# Patient Record
Sex: Female | Born: 1975 | Race: Black or African American | Hispanic: No | Marital: Married | Smoking: Never smoker
Health system: Southern US, Community
[De-identification: ages and names within clinical notes are randomized; demographics above are authoritative.]

## PROBLEM LIST (undated history)

## (undated) DIAGNOSIS — O24419 Gestational diabetes mellitus in pregnancy, unspecified control: Secondary | ICD-10-CM

## (undated) HISTORY — PX: WISDOM TOOTH EXTRACTION: SHX21

---

## 2009-01-13 ENCOUNTER — Inpatient Hospital Stay (HOSPITAL_COMMUNITY): Admission: AD | Admit: 2009-01-13 | Discharge: 2009-01-16 | Payer: Self-pay | Admitting: Obstetrics and Gynecology

## 2009-03-23 ENCOUNTER — Inpatient Hospital Stay (HOSPITAL_COMMUNITY): Admission: AD | Admit: 2009-03-23 | Discharge: 2009-03-25 | Payer: Self-pay | Admitting: Obstetrics & Gynecology

## 2010-12-26 NOTE — L&D Delivery Note (Signed)
Delivery Note  Ampicillin infused prior to AROM at 0512 Complete dilation at 0533 Onset of pushing at 0535 FHR second stage 140s no decels  Anesthesia: local for repair only / 1% xylocaine  Delivery of a viable female  at 46 by CNM in LOA position - mom lateral left Nuchal Cord none. Cord double clamped after cessation of pulsation, cut by FOB.  Cord blood sample collected. Placenta delivered [facility] intact with 3 VC at 0549 Placenta to L&D for disposal. Uterine tone firm / bleeding mild  Partial 2nd degree laceration identified.  Repair 3-0 vicryl vaginal / 4-0 vicryl subcuticular Est. Blood Loss (mL): 300 ml  Complications: none  Mom to postpartum.  Baby to nursery-stable.  BAILEY,TANYA 09/17/2011, 6:43 AM

## 2011-02-11 ENCOUNTER — Encounter: Payer: BC Managed Care – PPO | Attending: Certified Nurse Midwife | Admitting: Dietician

## 2011-02-11 DIAGNOSIS — O24919 Unspecified diabetes mellitus in pregnancy, unspecified trimester: Secondary | ICD-10-CM | POA: Insufficient documentation

## 2011-02-11 DIAGNOSIS — E119 Type 2 diabetes mellitus without complications: Secondary | ICD-10-CM | POA: Insufficient documentation

## 2011-02-21 LAB — ANTIBODY SCREEN: Antibody Screen: NEGATIVE

## 2011-02-21 LAB — HIV ANTIBODY (ROUTINE TESTING W REFLEX): HIV: NONREACTIVE

## 2011-02-21 LAB — RUBELLA ANTIBODY, IGM: Rubella: IMMUNE

## 2011-02-21 LAB — ABO/RH: RH Type: POSITIVE

## 2011-04-07 LAB — GLUCOSE, CAPILLARY
Glucose-Capillary: 100 mg/dL — ABNORMAL HIGH (ref 70–99)
Glucose-Capillary: 106 mg/dL — ABNORMAL HIGH (ref 70–99)
Glucose-Capillary: 106 mg/dL — ABNORMAL HIGH (ref 70–99)
Glucose-Capillary: 112 mg/dL — ABNORMAL HIGH (ref 70–99)
Glucose-Capillary: 115 mg/dL — ABNORMAL HIGH (ref 70–99)
Glucose-Capillary: 126 mg/dL — ABNORMAL HIGH (ref 70–99)
Glucose-Capillary: 98 mg/dL (ref 70–99)

## 2011-04-07 LAB — CBC
HCT: 29 % — ABNORMAL LOW (ref 36.0–46.0)
Hemoglobin: 11.6 g/dL — ABNORMAL LOW (ref 12.0–15.0)
MCV: 86 fL (ref 78.0–100.0)
Platelets: 208 10*3/uL (ref 150–400)
RBC: 3.37 MIL/uL — ABNORMAL LOW (ref 3.87–5.11)
RDW: 14.8 % (ref 11.5–15.5)
WBC: 14.9 10*3/uL — ABNORMAL HIGH (ref 4.0–10.5)

## 2011-04-07 LAB — RPR: RPR Ser Ql: NONREACTIVE

## 2011-04-11 LAB — GLUCOSE, CAPILLARY
Glucose-Capillary: 121 mg/dL — ABNORMAL HIGH (ref 70–99)
Glucose-Capillary: 121 mg/dL — ABNORMAL HIGH (ref 70–99)
Glucose-Capillary: 125 mg/dL — ABNORMAL HIGH (ref 70–99)
Glucose-Capillary: 144 mg/dL — ABNORMAL HIGH (ref 70–99)
Glucose-Capillary: 147 mg/dL — ABNORMAL HIGH (ref 70–99)
Glucose-Capillary: 154 mg/dL — ABNORMAL HIGH (ref 70–99)
Glucose-Capillary: 157 mg/dL — ABNORMAL HIGH (ref 70–99)
Glucose-Capillary: 176 mg/dL — ABNORMAL HIGH (ref 70–99)
Glucose-Capillary: 231 mg/dL — ABNORMAL HIGH (ref 70–99)

## 2011-04-11 LAB — KETONES, URINE: Ketones, ur: NEGATIVE mg/dL

## 2011-04-11 LAB — FETAL FIBRONECTIN: Fetal Fibronectin: NEGATIVE

## 2011-05-13 NOTE — Discharge Summary (Signed)
Maria Ross, [place]               ACCOUNT NO.:  [id number]   MEDICAL RECORD NO.:  [id number]          PATIENT TYPE:  INP   LOCATION:  9159                          FACILITY:  [facility]   PHYSICIAN:  Maxie Better, M.D.DATE OF BIRTH:  [birth year]   DATE OF ADMISSION:  01/13/2009  DATE OF DISCHARGE:  01/16/2009                               DISCHARGE SUMMARY   ADMISSION DIAGNOSES:  1. Intrauterine gestation at 28 weeks.  2. Uncontrolled class A1 gestational diabetes.   DISCHARGE DIAGNOSES:  Intrauterine gestation at 28+ weeks with class A2  gestational diabetes, improved controlled, undelivered.   HISTORY OF PRESENT ILLNESS:  A 35 year old gravida 1, para 0 female at  50 weeks admitted for blood sugar control.   HOSPITAL COURSE:  The patient had elevated blood sugars greater than  200.  Her 1-hour glucose testing showed blood sugars that were elevated  at 305, 3 hours after the test.  The blood sugar was still 220.  Prenatal course otherwise had been unremarkable.  She has a strong  family history of diabetes.   HOSPITAL COURSE:  The patient was placed on the Antepartum Service.  Diabetes consultation was obtained.  She was started on insulin regimen.  Hemoglobin A1c was done, which was 8.5.  This suggest probably  preexisting diabetes that was previously unrecognized.  She underwent  teaching from the diabetes standpoint, diabetic teaching.  She also had  an ultrasound that showed a 3-pound-4-ounce at 85th percentile.  Fetal  echocardiogram was scheduled for the patient.  She had upper normal  limits of the amniotic fluid at 23.5.  Her abdominal circumference on  the ultrasound was at the 92nd percentile for the fetus.  The patient  was noted on ultrasound to incidentally have a cervical length of 1.5  cm.  Fetal fibronectin was done which was negative.  She had a reactive  nonstress test throughout her course.  There was no contractions.  On  January 16, 2009, the patient was  doing much better and was deemed well  to be able to be discharged as an outpatient with management.  Her NPH  was 50 units at a.m. and p.m. and NovoLog 9 units before meals.   DISPOSITION:  Home.   CONDITION:  Stable.   DISCHARGE MEDICATIONS:  The insulin per the recommendations.  Follow-up  appointment with Dr. Welton Flakes on January 20, 2009, for a fetal echo at  [facility] as well on January 19, 2009.  The discharge instructions  for preterm labor was given as well as for diabetic teaching.     Maxie Better, M.D.  Electronically Signed    [place]/MEDQ  D:  02/08/2009  T:  02/09/2009  Job:  [id number]

## 2011-09-17 ENCOUNTER — Inpatient Hospital Stay (HOSPITAL_COMMUNITY)
Admission: AD | Admit: 2011-09-17 | Discharge: 2011-09-19 | DRG: 372 | Disposition: A | Discharge status: Home / Self Care | Payer: BC Managed Care – PPO | Source: Ambulatory Visit | Attending: Obstetrics | Admitting: Obstetrics

## 2011-09-17 ENCOUNTER — Encounter (HOSPITAL_COMMUNITY): Payer: Self-pay | Admitting: *Deleted

## 2011-09-17 DIAGNOSIS — O99892 Other specified diseases and conditions complicating childbirth: Secondary | ICD-10-CM | POA: Diagnosis present

## 2011-09-17 DIAGNOSIS — Z22330 Carrier of Group B streptococcus: Secondary | ICD-10-CM

## 2011-09-17 DIAGNOSIS — O2432 Unspecified pre-existing diabetes mellitus in childbirth: Secondary | ICD-10-CM | POA: Diagnosis present

## 2011-09-17 DIAGNOSIS — E119 Type 2 diabetes mellitus without complications: Secondary | ICD-10-CM | POA: Diagnosis present

## 2011-09-17 HISTORY — DX: Gestational diabetes mellitus in pregnancy, unspecified control: O24.419

## 2011-09-17 LAB — GLUCOSE, CAPILLARY: Glucose-Capillary: 83 mg/dL (ref 70–99)

## 2011-09-17 LAB — ABO/RH: ABO/RH(D): O POS

## 2011-09-17 LAB — CBC
HCT: 31.2 % — ABNORMAL LOW (ref 36.0–46.0)
Hemoglobin: 10.4 g/dL — ABNORMAL LOW (ref 12.0–15.0)
MCH: 26.7 pg (ref 26.0–34.0)
MCHC: 33.3 g/dL (ref 30.0–36.0)
MCV: 80 fL (ref 78.0–100.0)
Platelets: 257 10*3/uL (ref 150–400)
RBC: 3.9 MIL/uL (ref 3.87–5.11)
RDW: 14.9 % (ref 11.5–15.5)
WBC: 12 10*3/uL — ABNORMAL HIGH (ref 4.0–10.5)

## 2011-09-17 LAB — RPR: RPR Ser Ql: NONREACTIVE

## 2011-09-17 MED ORDER — OXYCODONE-ACETAMINOPHEN 5-325 MG PO TABS: 2.0000 | ORAL_TABLET | ORAL | Status: DC | PRN

## 2011-09-17 MED ORDER — BENZOCAINE-MENTHOL 20-0.5 % EX AERO
1.0000 "application " | INHALATION_SPRAY | CUTANEOUS | Status: DC | PRN
  Administered 2011-09-17: 1 via TOPICAL
  Filled 2011-09-17: qty 56

## 2011-09-17 MED ORDER — OXYTOCIN 10 UNIT/ML IJ SOLN
INTRAMUSCULAR | Status: AC
  Filled 2011-09-17: qty 2

## 2011-09-17 MED ORDER — HYDROCORTISONE ACE-PRAMOXINE 1-1 % RE CREA
TOPICAL_CREAM | Freq: Two times a day (BID) | RECTAL | Status: DC
  Filled 2011-09-17: qty 30

## 2011-09-17 MED ORDER — LANOLIN HYDROUS EX OINT: TOPICAL_OINTMENT | CUTANEOUS | Status: DC | PRN

## 2011-09-17 MED ORDER — OXYTOCIN 20 UNITS IN LACTATED RINGERS INFUSION - SIMPLE
125.0000 mL/h | Freq: Once | INTRAVENOUS | Status: AC
  Administered 2011-09-17: 500 mL/h via INTRAVENOUS

## 2011-09-17 MED ORDER — DIPHENHYDRAMINE HCL 25 MG PO CAPS: 25.0000 mg | ORAL_CAPSULE | Freq: Four times a day (QID) | ORAL | Status: DC | PRN

## 2011-09-17 MED ORDER — LIDOCAINE HCL (PF) 1 % IJ SOLN
30.0000 mL | INTRAMUSCULAR | Status: DC | PRN
  Filled 2011-09-17: qty 30

## 2011-09-17 MED ORDER — ONDANSETRON HCL 4 MG/2ML IJ SOLN: 4.0000 mg | Freq: Four times a day (QID) | INTRAMUSCULAR | Status: DC | PRN

## 2011-09-17 MED ORDER — IBUPROFEN 600 MG PO TABS
600.0000 mg | ORAL_TABLET | Freq: Four times a day (QID) | ORAL | Status: DC
  Administered 2011-09-17 – 2011-09-19 (×9): 600 mg via ORAL
  Filled 2011-09-17 (×8): qty 1

## 2011-09-17 MED ORDER — CITRIC ACID-SODIUM CITRATE 334-500 MG/5ML PO SOLN: 30.0000 mL | ORAL | Status: DC | PRN

## 2011-09-17 MED ORDER — ACETAMINOPHEN 325 MG PO TABS: 650.0000 mg | ORAL_TABLET | ORAL | Status: DC | PRN

## 2011-09-17 MED ORDER — LACTATED RINGERS IV SOLN: 500.0000 mL | INTRAVENOUS | Status: DC | PRN

## 2011-09-17 MED ORDER — SODIUM CHLORIDE 0.9 % IV SOLN
2.0000 g | Freq: Four times a day (QID) | INTRAVENOUS | Status: DC
  Administered 2011-09-17: 2 g via INTRAVENOUS
  Filled 2011-09-17 (×2): qty 2000

## 2011-09-17 MED ORDER — FLEET ENEMA 7-19 GM/118ML RE ENEM: 1.0000 | ENEMA | RECTAL | Status: DC | PRN

## 2011-09-17 MED ORDER — CEFAZOLIN SODIUM 1-5 GM-% IV SOLN
1.0000 g | Freq: Three times a day (TID) | INTRAVENOUS | Status: DC
  Filled 2011-09-17: qty 50

## 2011-09-17 MED ORDER — IBUPROFEN 600 MG PO TABS: 600.0000 mg | ORAL_TABLET | Freq: Four times a day (QID) | ORAL | Status: DC | PRN

## 2011-09-17 MED ORDER — OXYCODONE-ACETAMINOPHEN 5-325 MG PO TABS: 1.0000 | ORAL_TABLET | ORAL | Status: DC | PRN

## 2011-09-17 MED ORDER — SIMETHICONE 80 MG PO CHEW: 80.0000 mg | CHEWABLE_TABLET | ORAL | Status: DC | PRN

## 2011-09-17 MED ORDER — LACTATED RINGERS IV SOLN
INTRAVENOUS | Status: DC
  Administered 2011-09-17: 05:00:00 via INTRAVENOUS

## 2011-09-17 MED ORDER — METFORMIN HCL 500 MG PO TABS
1000.0000 mg | ORAL_TABLET | Freq: Two times a day (BID) | ORAL | Status: DC
  Administered 2011-09-17 – 2011-09-19 (×5): 1000 mg via ORAL
  Filled 2011-09-17 (×7): qty 2

## 2011-09-17 MED ORDER — OXYTOCIN BOLUS FROM INFUSION
500.0000 mL | Freq: Once | INTRAVENOUS | Status: DC
  Filled 2011-09-17: qty 1000
  Filled 2011-09-17: qty 500

## 2011-09-17 MED ORDER — WITCH HAZEL-GLYCERIN EX PADS: MEDICATED_PAD | CUTANEOUS | Status: DC | PRN

## 2011-09-17 MED ORDER — CEFAZOLIN SODIUM-DEXTROSE 2-3 GM-% IV SOLR
2.0000 g | Freq: Once | INTRAVENOUS | Status: DC
  Filled 2011-09-17: qty 50

## 2011-09-17 NOTE — H&P (Signed)
  OB ADMISSION/ HISTORY & PHYSICAL:  Admission Date: 09/17/2011  4:43 AM  Admit Diagnosis: Active Labor / IDDM  Maria Ross is a 35 y.o. female presenting for active labor at 37 4/7 weeks.  Prenatal History: [id number]   EDC : 10/04/2011, by Patient Reported  Prenatal care at [facility] since [redacted] weeks gestation  Prenatal course complicated by pre-existing DM on metformin.  Prenatal Labs: ABO, Rh: O (02/27 0000)  Antibody: Negative (02/27 0000) Rubella: Immune (02/27 0000)  RPR: Nonreactive (02/27 0000)  HBsAg: Negative (02/27 0000)  HIV: Non-reactive (02/27 0000)  GBS: Positive (09/04 0000)  1 hr Glucola :IDDM - pre-existing DM   Medical / Surgical History :  Past medical history:  IDDM - pre-existing diabetes   Past surgical history:  Past Surgical History  Procedure Date  . Wisdom tooth extraction      Family History: History reviewed. No pertinent family history. Mother IDDM.  Social History:  does not have a smoking history on file. She does not have any smokeless tobacco history on file. She reports that she does not drink alcohol or use illicit drugs.   Allergies: Review of patient's allergies indicates no known allergies.    Current Medications at time of admission: None this AM Metformin 1000mg  BID NPH 24/42 Humalog 10/04/09   Review of Systems: Onset of Labor 0330 - hx fast labor Painful regular ctx No LOF or SROM + bloody show    Physical Exam:  Uncomfortable breathing hard with ctx / calm between ctx Lungs - clear Heart - RRR Abdomen- gravid / non-tender Extremities - 1+ dependent edema  Dilation: 9 Effacement (%): 80 Station: -2 Exam by:: Maria Ross,CNM  FHR 140's no decels Toco ctx every 2 minutes and strong  BS - 88 on admit    Assessment: IDDM - well controlled with AGA fetus + GBS Active Labor   Plan:  Admit AROM Imminent birth  [facility] 09/17/2011, 6:32 AM

## 2011-09-18 LAB — CBC
HCT: 27.4 % — ABNORMAL LOW (ref 36.0–46.0)
Hemoglobin: 9.1 g/dL — ABNORMAL LOW (ref 12.0–15.0)
MCH: 26.9 pg (ref 26.0–34.0)
MCHC: 33.2 g/dL (ref 30.0–36.0)
MCV: 81.1 fL (ref 78.0–100.0)
Platelets: 220 10*3/uL (ref 150–400)
RBC: 3.38 MIL/uL — ABNORMAL LOW (ref 3.87–5.11)
RDW: 15.1 % (ref 11.5–15.5)
WBC: 13.4 10*3/uL — ABNORMAL HIGH (ref 4.0–10.5)

## 2011-09-18 LAB — GLUCOSE, CAPILLARY
Glucose-Capillary: 150 mg/dL — ABNORMAL HIGH (ref 70–99)
Glucose-Capillary: 118 mg/dL — ABNORMAL HIGH (ref 70–99)
Glucose-Capillary: 138 mg/dL — ABNORMAL HIGH (ref 70–99)

## 2011-09-18 MED ORDER — BENZOCAINE-MENTHOL 20-0.5 % EX AERO
INHALATION_SPRAY | CUTANEOUS | Status: AC
  Filled 2011-09-18: qty 56

## 2011-09-18 MED ORDER — INSULIN NPH (HUMAN) (ISOPHANE) 100 UNIT/ML ~~LOC~~ SUSP
8.0000 [IU] | Freq: Two times a day (BID) | SUBCUTANEOUS | Status: DC
  Administered 2011-09-18 – 2011-09-19 (×3): 8 [IU] via SUBCUTANEOUS
  Filled 2011-09-18: qty 10

## 2011-09-18 NOTE — Progress Notes (Signed)
  PPD 1 SVD  S:  Reports feeling well             Tolerating po/ No nausea or vomiting             Bleeding is light             Pain controlled withprescription NSAID's including motrin and metformin             Up ad lib / ambulatory  Newborn breast feeding  / Circumcision desired   O:  A & O x 3 NAD             VS: Blood pressure 107/71, pulse 74, temperature 97.6 F (36.4 C), temperature source Oral, resp. rate 20, height 5\' 7"  (1.702 m), weight 103.42 kg (228 lb), SpO2 98.00%, unknown if currently breastfeeding.  LABS: Lab Results  Component Value Date   WBC 13.4* 09/18/2011   HGB 9.1* 09/18/2011   HCT 27.4* 09/18/2011   MCV 81.1 09/18/2011   PLT 220 09/18/2011    FBS 119 /  Postprandial 134/144/145 yesterday   Lungs: unlabored  Heart: regular rate and rhythm  Abdomen: soft, non-tender, non-distended              Fundus: firm, non-tender, U-2  Perineum: no edema  Lochia: light  Extremities: 1+ edema, no calf pain or tenderness    A: PPD # 1               DM - metformin 1000 BID  Doing well - stable status  P:  Routine post partum orders  Add back some NPH and continue close glucose monitoring  Maura Braaten 09/18/2011, 8:37 AM

## 2011-09-19 LAB — GLUCOSE, CAPILLARY: Glucose-Capillary: 101 mg/dL — ABNORMAL HIGH (ref 70–99)

## 2011-09-19 MED ORDER — INSULIN NPH (HUMAN) (ISOPHANE) 100 UNIT/ML ~~LOC~~ SUSP: 10.0000 [IU] | Freq: Two times a day (BID) | SUBCUTANEOUS | Status: DC

## 2011-09-19 MED ORDER — HYDROCORTISONE ACE-PRAMOXINE 1-1 % RE CREA: TOPICAL_CREAM | Freq: Two times a day (BID) | RECTAL | Status: AC

## 2011-09-19 MED ORDER — IBUPROFEN 600 MG PO TABS: 600.0000 mg | ORAL_TABLET | Freq: Four times a day (QID) | ORAL | Status: AC

## 2011-09-19 NOTE — Discharge Summary (Signed)
Obstetric Discharge Summary Reason for Admission: onset of labor Prenatal Procedures: NST and ultrasound Intrapartum Procedures: spontaneous vaginal delivery Postpartum Procedures: none Complications-Operative and Postpartum: 2 degree perineal laceration Hemoglobin  Date Value Range Status  09/18/2011 9.1* 12.0-15.0 (g/dL) Final     HCT  Date Value Range Status  09/18/2011 27.4* 36.0-46.0 (%) Final    Discharge Diagnoses: Term Pregnancy-delivered and Pre-existing IDDM  Discharge Information: Date: 09/19/2011 Activity: pelvic rest Diet: routine Medications: PNV, Ibuprofen and Metformin 1000BID, NPH 10 units BID, OTC iron and vitamin D Condition: stable Instructions: refer to practice specific booklet Discharge to: home Follow-up Information    Follow up with Maria Ross. Make an appointment in 6 weeks.   Contact information:   [address] [place] [address] [phone]          Newborn Data: Live born female  Birth Weight: 6 lb 11.6 oz (3050 g) APGAR: 9, 9  Home with mother.  Maria Ross 09/19/2011, 8:45 AM

## 2011-09-19 NOTE — Progress Notes (Signed)
  PPD 2 SVD  S:  Reports feeling well - no pain             Tolerating po/ No nausea or vomiting             Bleeding is spotting             Pain controlled withprescription NSAID's including motrin             Up ad lib / ambulatory  Newborn breast feeding no issues  / Circumcision today prior to discharge             External labial and vaginal itching - intensely since late last night ( Ampicillin for GBS)             "starving "since delivery - wants to increase carbs but concerned about BS elevation  O:  A & O x 3 NAD             VS: Blood pressure 102/64, pulse 106, temperature 97.9 F (36.6 C), temperature source Oral, resp. rate 18, height 5\' 7"  (1.702 m), weight 103.42 kg (228 lb), SpO2 98.00%, unknown if currently breastfeeding.  LABS: Lab Results  Component Value Date   WBC 13.4* 09/18/2011   HGB 9.1* 09/18/2011   HCT 27.4* 09/18/2011   MCV 81.1 09/18/2011   PLT 220 09/18/2011    FBS 100 today / postprandial yesterday : 150-118-138 ( 8 units NPH BID)   Lungs: Clear and unlabored  Heart: regular rate and rhythm / no mumurs  Abdomen: soft, non-tender, non-distended              Fundus: firm, non-tender, U-2  Perineum: no edema / labial erythema and wetness appearance  Lochia: spotting  Extremities: trace edema, no calf pain or tenderness    A: PPD # 2             DM -insulin / agent requiring   Doing well - stable status  P:  Routine post partum orders  Discharge home             Fluconazole x 3 / ok topical antifungal (gyn-lotrimin) daily as needed             Increase NPH to 10 unit BID with increase in carbs at home              Continue metformin 1000 BID             Monitor BS - update CNM every 3 days with BS - call if less than 80 or over 180 for adjustments  Cederick Broadnax 09/19/2011, 8:34 AM

## 2011-10-04 NOTE — Progress Notes (Signed)
UR Chart review completed.  

## 2014-10-27 ENCOUNTER — Encounter (HOSPITAL_COMMUNITY): Payer: Self-pay | Admitting: *Deleted

## 2015-12-27 NOTE — L&D Delivery Note (Signed)
Delivery Note  First Stage: Labor onset: 1000 Augmentation : none Analgesia /Anesthesia intrapartum: none AROM at 1621  Second Stage: Complete dilation at 1647 Onset of pushing at 1715 FHR second stage variables to 90 nadir  Delivery of a viable female at [id number] by CNM in LOA position with mom in hands-knees no nuchal cord Cord double clamped after cessation of pulsation, cut by FOB Cord blood sample collected   Third Stage: Placenta delivered [facility] intact with 3 VC @ 1751 Placenta disposition: hospital disposal Uterine tone firm / bleeding moderate  1st degree perineal laceration identified  Anesthesia for repair: 1% xylocaine local Repair 4-0 vicryl subcuticular Est. Blood Loss (mL): 450  Complications: none  Mom to postpartum.  Baby to Couplet care / Skin to Skin.  Newborn: Birth Weight: 8-10 Apgar Scores: 7-9 Feeding planned: breast  Maria Ross, Maria Ross CNM, MSN, FACNM 09/09/2016, 6:03 PM

## 2016-03-01 LAB — OB RESULTS CONSOLE GC/CHLAMYDIA
Chlamydia: NEGATIVE
GC PROBE AMP, GENITAL: NEGATIVE

## 2016-03-01 LAB — OB RESULTS CONSOLE HEPATITIS B SURFACE ANTIGEN: Hepatitis B Surface Ag: NEGATIVE

## 2016-03-01 LAB — OB RESULTS CONSOLE ABO/RH: RH Type: POSITIVE

## 2016-03-01 LAB — OB RESULTS CONSOLE RPR: RPR: NONREACTIVE

## 2016-03-01 LAB — OB RESULTS CONSOLE RUBELLA ANTIBODY, IGM: Rubella: IMMUNE

## 2016-03-01 LAB — OB RESULTS CONSOLE HIV ANTIBODY (ROUTINE TESTING): HIV: NONREACTIVE

## 2016-07-01 ENCOUNTER — Inpatient Hospital Stay (HOSPITAL_COMMUNITY): Admission: AD | Admit: 2016-07-01 | Payer: Self-pay | Source: Ambulatory Visit | Admitting: Obstetrics and Gynecology

## 2016-08-18 LAB — OB RESULTS CONSOLE GBS: STREP GROUP B AG: POSITIVE

## 2016-09-09 ENCOUNTER — Encounter (HOSPITAL_COMMUNITY): Payer: Self-pay | Admitting: *Deleted

## 2016-09-09 ENCOUNTER — Inpatient Hospital Stay (HOSPITAL_COMMUNITY)
Admission: AD | Admit: 2016-09-09 | Discharge: 2016-09-11 | DRG: 774 | Disposition: A | Discharge status: Home / Self Care | Payer: BLUE CROSS/BLUE SHIELD | Source: Ambulatory Visit | Attending: Obstetrics and Gynecology | Admitting: Obstetrics and Gynecology

## 2016-09-09 DIAGNOSIS — D509 Iron deficiency anemia, unspecified: Secondary | ICD-10-CM | POA: Diagnosis present

## 2016-09-09 DIAGNOSIS — O2412 Pre-existing diabetes mellitus, type 2, in childbirth: Principal | ICD-10-CM | POA: Diagnosis present

## 2016-09-09 DIAGNOSIS — O1205 Gestational edema, complicating the puerperium: Secondary | ICD-10-CM | POA: Diagnosis present

## 2016-09-09 DIAGNOSIS — E119 Type 2 diabetes mellitus without complications: Secondary | ICD-10-CM | POA: Diagnosis present

## 2016-09-09 DIAGNOSIS — O99824 Streptococcus B carrier state complicating childbirth: Secondary | ICD-10-CM | POA: Diagnosis present

## 2016-09-09 DIAGNOSIS — Z3A38 38 weeks gestation of pregnancy: Secondary | ICD-10-CM

## 2016-09-09 DIAGNOSIS — Z794 Long term (current) use of insulin: Secondary | ICD-10-CM | POA: Diagnosis not present

## 2016-09-09 DIAGNOSIS — O9902 Anemia complicating childbirth: Secondary | ICD-10-CM | POA: Diagnosis present

## 2016-09-09 LAB — GLUCOSE, CAPILLARY
GLUCOSE-CAPILLARY: 107 mg/dL — AB (ref 65–99)
Glucose-Capillary: 132 mg/dL — ABNORMAL HIGH (ref 65–99)
Glucose-Capillary: 185 mg/dL — ABNORMAL HIGH (ref 65–99)

## 2016-09-09 LAB — CBC
HCT: 29.8 % — ABNORMAL LOW (ref 36.0–46.0)
Hemoglobin: 10.1 g/dL — ABNORMAL LOW (ref 12.0–15.0)
MCH: 26.8 pg (ref 26.0–34.0)
MCHC: 33.9 g/dL (ref 30.0–36.0)
MCV: 79 fL (ref 78.0–100.0)
Platelets: 266 10*3/uL (ref 150–400)
RBC: 3.77 MIL/uL — ABNORMAL LOW (ref 3.87–5.11)
RDW: 15.3 % (ref 11.5–15.5)
WBC: 12.2 10*3/uL — ABNORMAL HIGH (ref 4.0–10.5)

## 2016-09-09 LAB — TYPE AND SCREEN
ABO/RH(D): O POS
Antibody Screen: NEGATIVE

## 2016-09-09 MED ORDER — ACETAMINOPHEN 325 MG PO TABS: 650.0000 mg | ORAL_TABLET | ORAL | Status: DC | PRN

## 2016-09-09 MED ORDER — IBUPROFEN 600 MG PO TABS
600.0000 mg | ORAL_TABLET | Freq: Four times a day (QID) | ORAL | Status: DC
  Administered 2016-09-09 – 2016-09-10 (×3): 600 mg via ORAL
  Filled 2016-09-09 (×3): qty 1

## 2016-09-09 MED ORDER — OXYCODONE-ACETAMINOPHEN 5-325 MG PO TABS: 1.0000 | ORAL_TABLET | ORAL | Status: DC | PRN

## 2016-09-09 MED ORDER — LACTATED RINGERS IV SOLN: 500.0000 mL | INTRAVENOUS | Status: DC | PRN

## 2016-09-09 MED ORDER — OXYTOCIN 40 UNITS IN LACTATED RINGERS INFUSION - SIMPLE MED
INTRAVENOUS | Status: AC
  Administered 2016-09-09: 62.5 mL/h
  Filled 2016-09-09: qty 1000

## 2016-09-09 MED ORDER — DIBUCAINE 1 % RE OINT: 1.0000 "application " | TOPICAL_OINTMENT | RECTAL | Status: DC | PRN

## 2016-09-09 MED ORDER — METFORMIN HCL 500 MG PO TABS
1000.0000 mg | ORAL_TABLET | Freq: Two times a day (BID) | ORAL | Status: DC
  Administered 2016-09-09 – 2016-09-11 (×4): 1000 mg via ORAL
  Filled 2016-09-09 (×4): qty 2

## 2016-09-09 MED ORDER — COCONUT OIL OIL: 1.0000 "application " | TOPICAL_OIL | Status: DC | PRN

## 2016-09-09 MED ORDER — SODIUM CHLORIDE 0.9 % IV SOLN
2.0000 g | Freq: Once | INTRAVENOUS | Status: AC
  Administered 2016-09-09: 2 g via INTRAVENOUS
  Filled 2016-09-09: qty 2000

## 2016-09-09 MED ORDER — VITAMIN K1 1 MG/0.5ML IJ SOLN
INTRAMUSCULAR | Status: AC
  Filled 2016-09-09: qty 0.5

## 2016-09-09 MED ORDER — OXYTOCIN 40 UNITS IN LACTATED RINGERS INFUSION - SIMPLE MED: 2.5000 [IU]/h | INTRAVENOUS | Status: DC

## 2016-09-09 MED ORDER — LACTATED RINGERS IV SOLN
INTRAVENOUS | Status: DC
  Administered 2016-09-09: 125 mL via INTRAVENOUS

## 2016-09-09 MED ORDER — OXYCODONE-ACETAMINOPHEN 5-325 MG PO TABS: 2.0000 | ORAL_TABLET | ORAL | Status: DC | PRN

## 2016-09-09 MED ORDER — INSULIN NPH (HUMAN) (ISOPHANE) 100 UNIT/ML ~~LOC~~ SUSP
10.0000 [IU] | Freq: Two times a day (BID) | SUBCUTANEOUS | Status: DC
  Administered 2016-09-09 – 2016-09-10 (×2): 10 [IU] via SUBCUTANEOUS
  Filled 2016-09-09: qty 10

## 2016-09-09 MED ORDER — SENNOSIDES-DOCUSATE SODIUM 8.6-50 MG PO TABS
2.0000 | ORAL_TABLET | ORAL | Status: DC
  Administered 2016-09-10 (×2): 2 via ORAL
  Filled 2016-09-09 (×2): qty 2

## 2016-09-09 MED ORDER — WITCH HAZEL-GLYCERIN EX PADS: 1.0000 "application " | MEDICATED_PAD | CUTANEOUS | Status: DC | PRN

## 2016-09-09 MED ORDER — BENZOCAINE-MENTHOL 20-0.5 % EX AERO: 1.0000 "application " | INHALATION_SPRAY | CUTANEOUS | Status: DC | PRN

## 2016-09-09 MED ORDER — OXYTOCIN BOLUS FROM INFUSION
500.0000 mL | Freq: Once | INTRAVENOUS | Status: AC
  Administered 2016-09-09: 500 mL via INTRAVENOUS

## 2016-09-09 MED ORDER — SOD CITRATE-CITRIC ACID 500-334 MG/5ML PO SOLN: 30.0000 mL | ORAL | Status: DC | PRN

## 2016-09-09 MED ORDER — SIMETHICONE 80 MG PO CHEW: 80.0000 mg | CHEWABLE_TABLET | ORAL | Status: DC | PRN

## 2016-09-09 MED ORDER — LIDOCAINE HCL (PF) 1 % IJ SOLN
30.0000 mL | INTRAMUSCULAR | Status: DC | PRN
  Administered 2016-09-09: 30 mL via SUBCUTANEOUS
  Filled 2016-09-09: qty 30

## 2016-09-09 NOTE — H&P (Signed)
  OB ADMISSION/ HISTORY & PHYSICAL:  Admission Date: 09/09/2016  2:14 PM  Admit Diagnosis: 38.3 weeks / active labor / IDDM (pre-existing type 2 DM) / positive GBS  Maria Ross is a 40 y.o. female presenting for onset of labor.  Prenatal History: [id number]   EDC : 09/20/2016, by Other Basis  [facility]  Primary Ob Provider: Marlinda Mikeanya Conlin Brahm CNM / Dr Juliene PinaMody  Prenatal course complicated by IDDM (pre-existing type 2 DM) / positive GBS / hx rapid labor progeression  Twice weekly fetal surveillance - normal / AGA / normal AFI  Prenatal Labs: ABO, Rh: O/Positive/-- (03/07 0000) Antibody:  negative Rubella: Immune (03/07 0000)  RPR: Nonreactive (03/07 0000)  HBsAg: Negative (03/07 0000)  HIV: Non-reactive (03/07 0000)  GTT: pre-existing DM - A1c6.0 (July 2017) GBS: Positive (08/24 0000)  Normal renal function  Medical / Surgical History :  Past medical history:  Past Medical History:  Diagnosis Date  . Gestational diabetes         Type 2 diabetes - insulin requiring  Past surgical history:  Past Surgical History:  Procedure Laterality Date  . WISDOM TOOTH EXTRACTION      Family History: History reviewed. No pertinent family history.   Social History:  reports that she has never smoked. She does not have any smokeless tobacco history on file. She reports that she does not drink alcohol or use drugs.   Allergies: Review of patient's allergies indicates no known allergies.    Current Medications at time of admission:  Prior to Admission medications   NPH Am 22/ PM 42 humalog 12-16 AC meals Metformin 500 BID  Review of Systems: Active FM onset of ctx @ 1000 currently every 4-5 minutes No LOF bloody show present  Physical Exam:  VS: Weight 105.7 kg (233 lb), unknown if currently breastfeeding.  General: alert and oriented, appears calm and comfortable Heart: RRR Lungs: Clear lung fields Abdomen: Gravid, soft and non-tender,  non-distended / uterus: gravid Extremities: 2+ pedal  edema  Genitalia / VE:  8cm / 90% / BBOW / vtx -1  FHR: baseline rate 150 / variability moderate / accelerations + / no decelerations TOCO: Q 4  Assessment: 38.[redacted] weeks gestation active stage of labor FHR category 1 GBS positive Hx rapid labor progression Type 2 DM - IDDM  Plan:  Admit Ampicillin 2gm IVPB Expectant management - anticipate SVB  Dr Cherly Hensenousins notified of admission / plan of care   Marlinda MikeBAILEY, Maria Ross CNM, MSN, [facility] 09/09/2016, 3:30 PM

## 2016-09-09 NOTE — Progress Notes (Addendum)
Pt admitted from home for spont labor. Admission charting completed.   MD at bs discussing POC with patient. See flow sheet for details.   1600: SVE 8. Room setup for pending delivery by surgical tech.   1420: FSBS done 132. CNM aware.   1647: will start pushing with ctx  1700: bearing down with contraction while on all fours. Provider at bs.   1724: cont to push with ctx  Baby girl APGAR: 8/9 EBL: 450  See flow sheet for PP recovery  1900: medicated with ibuprofen 800 mg PO pain level 4/5. Crackers and drink provided.

## 2016-09-10 LAB — COMPREHENSIVE METABOLIC PANEL
ALT: 16 U/L (ref 14–54)
AST: 33 U/L (ref 15–41)
Albumin: 2.4 g/dL — ABNORMAL LOW (ref 3.5–5.0)
Alkaline Phosphatase: 318 U/L — ABNORMAL HIGH (ref 38–126)
Anion gap: 6 (ref 5–15)
BUN: 11 mg/dL (ref 6–20)
CO2: 22 mmol/L (ref 22–32)
Calcium: 8.6 mg/dL — ABNORMAL LOW (ref 8.9–10.3)
Chloride: 105 mmol/L (ref 101–111)
Creatinine, Ser: 0.75 mg/dL (ref 0.44–1.00)
GFR calc Af Amer: >60 mL/min (ref >60)
GFR calc non Af Amer: >60 mL/min (ref >60)
Glucose, Bld: 151 mg/dL — ABNORMAL HIGH (ref 65–99)
Potassium: 4.4 mmol/L (ref 3.5–5.1)
Sodium: 133 mmol/L — ABNORMAL LOW (ref 135–145)
Total Bilirubin: 0.7 mg/dL (ref 0.3–1.2)
Total Protein: 5.9 g/dL — ABNORMAL LOW (ref 6.5–8.1)

## 2016-09-10 LAB — CBC
HCT: 24.9 % — ABNORMAL LOW (ref 36.0–46.0)
Hemoglobin: 8.4 g/dL — ABNORMAL LOW (ref 12.0–15.0)
MCH: 26.6 pg (ref 26.0–34.0)
MCHC: 33.7 g/dL (ref 30.0–36.0)
MCV: 78.8 fL (ref 78.0–100.0)
Platelets: 251 10*3/uL (ref 150–400)
RBC: 3.16 MIL/uL — ABNORMAL LOW (ref 3.87–5.11)
RDW: 15.2 % (ref 11.5–15.5)
WBC: 12.9 10*3/uL — ABNORMAL HIGH (ref 4.0–10.5)

## 2016-09-10 LAB — GLUCOSE, CAPILLARY
GLUCOSE-CAPILLARY: 121 mg/dL — AB (ref 65–99)
Glucose-Capillary: 145 mg/dL — ABNORMAL HIGH (ref 65–99)
Glucose-Capillary: 131 mg/dL — ABNORMAL HIGH (ref 65–99)

## 2016-09-10 LAB — RPR: RPR Ser Ql: NONREACTIVE

## 2016-09-10 MED ORDER — INSULIN NPH (HUMAN) (ISOPHANE) 100 UNIT/ML ~~LOC~~ SUSP
15.0000 [IU] | Freq: Two times a day (BID) | SUBCUTANEOUS | Status: DC
  Administered 2016-09-10 – 2016-09-11 (×2): 15 [IU] via SUBCUTANEOUS
  Filled 2016-09-10: qty 10

## 2016-09-10 MED ORDER — HYDROCHLOROTHIAZIDE 12.5 MG PO CAPS
25.0000 mg | ORAL_CAPSULE | ORAL | Status: AC
  Administered 2016-09-10: 25 mg via ORAL
  Filled 2016-09-10: qty 2

## 2016-09-10 MED ORDER — POLYSACCHARIDE IRON COMPLEX 150 MG PO CAPS
150.0000 mg | ORAL_CAPSULE | Freq: Every day | ORAL | Status: DC
  Administered 2016-09-11: 150 mg via ORAL
  Filled 2016-09-10: qty 1

## 2016-09-10 MED ORDER — INFLUENZA VAC SPLIT QUAD 0.5 ML IM SUSY: 0.5000 mL | PREFILLED_SYRINGE | INTRAMUSCULAR | Status: DC

## 2016-09-10 MED ORDER — IBUPROFEN 800 MG PO TABS
800.0000 mg | ORAL_TABLET | Freq: Three times a day (TID) | ORAL | Status: DC
  Administered 2016-09-10 – 2016-09-11 (×3): 800 mg via ORAL
  Filled 2016-09-10 (×3): qty 1

## 2016-09-10 MED ORDER — INSULIN NPH (HUMAN) (ISOPHANE) 100 UNIT/ML ~~LOC~~ SUSP: 15.0000 [IU] | Freq: Two times a day (BID) | SUBCUTANEOUS | Status: DC

## 2016-09-10 MED ORDER — MAGNESIUM OXIDE 400 (241.3 MG) MG PO TABS
400.0000 mg | ORAL_TABLET | Freq: Every day | ORAL | Status: DC
  Administered 2016-09-10 – 2016-09-11 (×2): 400 mg via ORAL
  Filled 2016-09-10 (×2): qty 1

## 2016-09-10 NOTE — Progress Notes (Signed)
PPD 1 SVD with 1st degree repair  S:  Reports feeling well - really good             Tolerating po/ No nausea or vomiting             Bleeding is light             Pain controlled with motrin but cramps still rough with feedings             Up ad lib / ambulatory / voiding QS  Newborn breast feeding    O:               VS: BP 138/73 (BP Location: Right Arm)   Pulse 83   Temp 97.7 F (36.5 C) (Oral)   Resp 18   Ht 5\' 7"  (1.702 m)   Wt 105.7 kg (233 lb)   Breastfeeding? Unknown   BMI 36.49 kg/m    LABS:  Glucose post delivery 107 / post bagel snack (nurse fearful of dropping glucose with meds) 185 AM fasting 121             Recent Labs  09/09/16 1512 09/10/16 0505  WBC 12.2* 12.9*  HGB 10.1* 8.4*  PLT 266 251               Blood type: --/--/O POS (09/15 1512)  Rubella: Immune (03/07 0000)                     I&O: Intake/Output      09/15 0701 - 09/16 0700 09/16 0701 - 09/17 0700   Urine (mL/kg/hr) 0    Blood 900    Total Output 900     Net -900          Urine Occurrence 1 x                  Physical Exam:             Alert and oriented X3  Lungs: Clear and unlabored  Heart: regular rate and rhythm / no mumurs  Abdomen: soft, non-tender, non-distended              Fundus: firm, non-tender, U-1  Perineum: no edema  Lochia: light  Extremities: 1+edema, no calf pain or tenderness    A: PPD # 1             IDDM - pre-existing / well-controlled & compliant             IDA of pregnancy             Dependent edema  Doing well - stable status  P: Routine post partum orders  HCTZ x 3 days             Iron and magnesium             Adjusted insulin dose 15 NPH BID with metformin 1000mg  BID  Marlinda MikeBAILEY, Misha Antonini CNM, MSN, FACNM 09/10/2016, 11:01 AM

## 2016-09-10 NOTE — Lactation Note (Signed)
This note was copied from a baby's chart. Lactation Consultation Note  Patient Name: Maria Ross Today's Date: 09/10/2016 Reason for consult: Initial assessment (18 hour old , initially decreased blood sugars , per mom last breast fed at 1000-for 20 mins )  Baby is skin to skin with dad after bath , per mom had use the rest room and eat lunch . LC discussed the importance of sharing the  Skin to skin due to increasing moms hormones for breast feeding. Per mom the baby doesn't seem to opening wide enough and initially discomfort And then improves. LC reassured when she calls [facility] can assess what baby is doing at the breast.  LC encouraged mom to call for [facility] for feeding assessment on the nurses light , also mentioned RN's can due latch scores to .  Mother informed of post-discharge support and given phone number to the lactation department, including services for phone call assistance; out-patient appointments; and breastfeeding support group. List of other breastfeeding resources in the community given in the handout. Encouraged mother to call for problems or concerns related to breastfeeding.   Maternal Data Has patient been taught Hand Expression?:  (mom eating lunch , unable to show hand expressing, mom to call ) Does the patient have breastfeeding experience prior to this delivery?: Yes  Feeding Feeding Type: Breast Fed Length of feed: 20 min (per mom )  LATCH Score/Interventions Latch: Grasps breast easily, tongue down, lips flanged, rhythmical sucking.  Audible Swallowing: A few with stimulation Intervention(s): Skin to skin;Hand expression;Alternate breast massage  Type of Nipple: Everted at rest and after stimulation  Comfort (Breast/Nipple): Soft / non-tender     Hold (Positioning): No assistance needed to correctly position infant at breast. Intervention(s): Breastfeeding basics reviewed  LATCH Score: 9  Lactation Tools Discussed/Used      Consult Status Consult Status: Follow-up Date: 09/10/16 Follow-up type: In-patient    Kathrin Greathouseorio, Berenize Gatlin Ann 09/10/2016, 12:46 PM

## 2016-09-11 LAB — GLUCOSE, CAPILLARY
GLUCOSE-CAPILLARY: 68 mg/dL (ref 65–99)
GLUCOSE-CAPILLARY: 94 mg/dL (ref 65–99)

## 2016-09-11 MED ORDER — POLYSACCHARIDE IRON COMPLEX 150 MG PO CAPS: 150.0000 mg | ORAL_CAPSULE | Freq: Every day | ORAL | 0 refills | Status: AC

## 2016-09-11 MED ORDER — MAGNESIUM OXIDE 400 (241.3 MG) MG PO TABS: 400.0000 mg | ORAL_TABLET | Freq: Every day | ORAL | 0 refills | Status: AC

## 2016-09-11 MED ORDER — TETANUS-DIPHTH-ACELL PERTUSSIS 5-2.5-18.5 LF-MCG/0.5 IM SUSP
0.5000 mL | Freq: Once | INTRAMUSCULAR | Status: AC
  Administered 2016-09-11: 0.5 mL via INTRAMUSCULAR
  Filled 2016-09-11: qty 0.5

## 2016-09-11 MED ORDER — INSULIN NPH (HUMAN) (ISOPHANE) 100 UNIT/ML ~~LOC~~ SUSP: 15.0000 [IU] | Freq: Two times a day (BID) | SUBCUTANEOUS | 11 refills | Status: AC

## 2016-09-11 MED ORDER — METFORMIN HCL 1000 MG PO TABS: 1000.0000 mg | ORAL_TABLET | Freq: Two times a day (BID) | ORAL | 0 refills | Status: AC

## 2016-09-11 MED ORDER — IBUPROFEN 800 MG PO TABS: 800.0000 mg | ORAL_TABLET | Freq: Three times a day (TID) | ORAL | 0 refills | Status: AC

## 2016-09-11 NOTE — Discharge Summary (Signed)
Obstetric Discharge Summary Reason for Admission: onset of labor Prenatal Procedures: NST and ultrasound Intrapartum Procedures: spontaneous vaginal delivery and GBS prophylaxis Postpartum Procedures: none Complications-Operative and Postpartum: 1st degree perineal laceration Hemoglobin  Date Value Ref Range Status  09/10/2016 8.4 (L) 12.0 - 15.0 g/dL Final   HCT  Date Value Ref Range Status  09/10/2016 24.9 (L) 36.0 - 46.0 % Final    Physical Exam:  General: alert, cooperative and no distress Lochia: appropriate Uterine Fundus: firm Incision: healing well DVT Evaluation: No evidence of DVT seen on physical exam.  Discharge Diagnoses: Term Pregnancy-delivered and Type 2 DM  Discharge Information: Date: 09/11/2016 Activity: pelvic rest Diet: routine Medications: PNV, Ibuprofen, Iron and Magnesium, metformin 1000 BID, NPH 15/15 Condition: stable Instructions: refer to practice specific booklet Discharge to: home Follow-up: Marlinda Mikeanya Annasophia Crocker CNM - [facility] Newborn Data: Live born female  Birth Weight: 8-10  APGAR: 7, 9  Home with mother.  Marlinda MikeBAILEY, Jaqlyn Gruenhagen 09/11/2016, 10:58 AM

## 2016-09-11 NOTE — Progress Notes (Signed)
PPD 2 SVD  S:  Reports feeling well - ready to go home             Tolerating po/ No nausea or vomiting             Bleeding is light             Pain controlled with motrin             Up ad lib / ambulatory / voiding QS  Newborn breast feeding  / milk in today O:               VS: BP (!) 101/58 (BP Location: Left Arm)   Pulse 84   Temp 97.8 F (36.6 C) (Oral)   Resp 18   Ht 5\' 7"  (1.702 m)   Wt 105.7 kg (233 lb)   SpO2 99%   Breastfeeding? Unknown   BMI 36.49 kg/m                 Physical Exam:             Alert and oriented X3  Abdomen: soft, non-tender, non-distended              Fundus: firm, non-tender, U-1  Perineum: mild edema  Lochia: light  Extremities: 1+ pedal edema, no calf pain or tenderness    A: PPD # 2              Type 2 DM - stable BS on Metformin 1000 BID and NPH 15/15  Doing well - stable status  P: Routine post partum orders  DC home  Marlinda MikeBAILEY, Parmvir Boomer CNM, MSN, [facility] 09/11/2016, 10:53 AM

## 2019-10-01 DIAGNOSIS — E7800 Pure hypercholesterolemia, unspecified: Secondary | ICD-10-CM | POA: Diagnosis not present

## 2019-10-01 DIAGNOSIS — O24419 Gestational diabetes mellitus in pregnancy, unspecified control: Secondary | ICD-10-CM | POA: Diagnosis not present

## 2019-10-01 DIAGNOSIS — Z8632 Personal history of gestational diabetes: Secondary | ICD-10-CM | POA: Diagnosis not present

## 2019-10-01 DIAGNOSIS — Z0000 Encounter for general adult medical examination without abnormal findings: Secondary | ICD-10-CM | POA: Diagnosis not present

## 2019-10-01 DIAGNOSIS — R739 Hyperglycemia, unspecified: Secondary | ICD-10-CM | POA: Diagnosis not present

## 2019-10-01 DIAGNOSIS — Z7189 Other specified counseling: Secondary | ICD-10-CM | POA: Diagnosis not present

## 2019-10-01 DIAGNOSIS — R946 Abnormal results of thyroid function studies: Secondary | ICD-10-CM | POA: Diagnosis not present

## 2019-10-09 ENCOUNTER — Other Ambulatory Visit: Payer: Self-pay | Admitting: Family Medicine

## 2019-10-09 DIAGNOSIS — Z1231 Encounter for screening mammogram for malignant neoplasm of breast: Secondary | ICD-10-CM

## 2019-10-09 DIAGNOSIS — Z0000 Encounter for general adult medical examination without abnormal findings: Secondary | ICD-10-CM | POA: Diagnosis not present

## 2019-10-09 DIAGNOSIS — E1165 Type 2 diabetes mellitus with hyperglycemia: Secondary | ICD-10-CM | POA: Diagnosis not present

## 2019-10-23 DIAGNOSIS — E1165 Type 2 diabetes mellitus with hyperglycemia: Secondary | ICD-10-CM | POA: Diagnosis not present

## 2019-11-27 ENCOUNTER — Ambulatory Visit
Admission: RE | Admit: 2019-11-27 | Discharge: 2019-11-27 | Disposition: A | Discharge status: Home / Self Care | Payer: BC Managed Care – PPO | Source: Ambulatory Visit | Attending: Family Medicine | Admitting: Family Medicine

## 2019-11-27 ENCOUNTER — Other Ambulatory Visit: Payer: Self-pay

## 2019-11-27 DIAGNOSIS — Z1231 Encounter for screening mammogram for malignant neoplasm of breast: Secondary | ICD-10-CM | POA: Diagnosis not present

## 2020-01-15 DIAGNOSIS — E1165 Type 2 diabetes mellitus with hyperglycemia: Secondary | ICD-10-CM | POA: Diagnosis not present

## 2020-01-22 DIAGNOSIS — Z7189 Other specified counseling: Secondary | ICD-10-CM | POA: Diagnosis not present

## 2020-01-22 DIAGNOSIS — E119 Type 2 diabetes mellitus without complications: Secondary | ICD-10-CM | POA: Diagnosis not present

## 2020-05-14 DIAGNOSIS — E119 Type 2 diabetes mellitus without complications: Secondary | ICD-10-CM | POA: Diagnosis not present

## 2020-05-21 DIAGNOSIS — E119 Type 2 diabetes mellitus without complications: Secondary | ICD-10-CM | POA: Diagnosis not present

## 2020-05-21 DIAGNOSIS — N951 Menopausal and female climacteric states: Secondary | ICD-10-CM | POA: Diagnosis not present

## 2020-10-22 ENCOUNTER — Other Ambulatory Visit: Payer: Self-pay | Admitting: Family Medicine

## 2020-10-22 DIAGNOSIS — Z1231 Encounter for screening mammogram for malignant neoplasm of breast: Secondary | ICD-10-CM

## 2020-11-17 DIAGNOSIS — E1165 Type 2 diabetes mellitus with hyperglycemia: Secondary | ICD-10-CM | POA: Diagnosis not present

## 2020-11-17 DIAGNOSIS — E119 Type 2 diabetes mellitus without complications: Secondary | ICD-10-CM | POA: Diagnosis not present

## 2020-11-17 DIAGNOSIS — R635 Abnormal weight gain: Secondary | ICD-10-CM | POA: Diagnosis not present

## 2020-11-24 DIAGNOSIS — E7800 Pure hypercholesterolemia, unspecified: Secondary | ICD-10-CM | POA: Diagnosis not present

## 2020-11-24 DIAGNOSIS — Z0000 Encounter for general adult medical examination without abnormal findings: Secondary | ICD-10-CM | POA: Diagnosis not present

## 2020-11-24 DIAGNOSIS — E119 Type 2 diabetes mellitus without complications: Secondary | ICD-10-CM | POA: Diagnosis not present

## 2020-11-30 ENCOUNTER — Ambulatory Visit
Admission: RE | Admit: 2020-11-30 | Discharge: 2020-11-30 | Disposition: A | Discharge status: Home / Self Care | Payer: BC Managed Care – PPO | Source: Ambulatory Visit | Attending: Family Medicine | Admitting: Family Medicine

## 2020-11-30 ENCOUNTER — Other Ambulatory Visit: Payer: Self-pay

## 2020-11-30 DIAGNOSIS — Z1231 Encounter for screening mammogram for malignant neoplasm of breast: Secondary | ICD-10-CM | POA: Diagnosis not present

## 2021-02-03 ENCOUNTER — Emergency Department (HOSPITAL_COMMUNITY)
Admission: EM | Admit: 2021-02-03 | Discharge: 2021-02-03 | Disposition: A | Discharge status: Home / Self Care | Payer: BC Managed Care – PPO | Attending: Emergency Medicine | Admitting: Emergency Medicine

## 2021-02-03 ENCOUNTER — Encounter (HOSPITAL_COMMUNITY): Payer: Self-pay

## 2021-02-03 ENCOUNTER — Other Ambulatory Visit: Payer: Self-pay

## 2021-02-03 DIAGNOSIS — Z7984 Long term (current) use of oral hypoglycemic drugs: Secondary | ICD-10-CM | POA: Insufficient documentation

## 2021-02-03 DIAGNOSIS — Z794 Long term (current) use of insulin: Secondary | ICD-10-CM | POA: Diagnosis not present

## 2021-02-03 DIAGNOSIS — G510 Bell's palsy: Secondary | ICD-10-CM | POA: Diagnosis not present

## 2021-02-03 DIAGNOSIS — E119 Type 2 diabetes mellitus without complications: Secondary | ICD-10-CM | POA: Insufficient documentation

## 2021-02-03 DIAGNOSIS — R29810 Facial weakness: Secondary | ICD-10-CM | POA: Diagnosis not present

## 2021-02-03 MED ORDER — VALACYCLOVIR HCL 1 G PO TABS: 1000.0000 mg | ORAL_TABLET | Freq: Three times a day (TID) | ORAL | 0 refills | Status: AC

## 2021-02-03 MED ORDER — PREDNISONE 10 MG PO TABS: 60.0000 mg | ORAL_TABLET | Freq: Every day | ORAL | 0 refills | Status: AC

## 2021-02-03 MED ORDER — ARTIFICIAL TEARS OPHTHALMIC OINT: TOPICAL_OINTMENT | Freq: Every evening | OPHTHALMIC | 0 refills | Status: AC | PRN

## 2021-02-03 NOTE — ED Triage Notes (Signed)
Pt presents with c/o facial droop on the left side for 2 days. Pt also reports her right eye has been twitching for 2 days. Pt reports this all began with ear pain.

## 2021-02-03 NOTE — ED Provider Notes (Signed)
[facility]-EMERGENCY DEPT Provider Note   CSN: [id number] Arrival date & time: 02/03/21  1035     History Chief Complaint  Patient presents with  . Facial Droop    Maria Ross is a 45 y.o. female she denies any past medical history apart from well-controlled diabetes. No history of stroke  HPI Patient is 45 year old female presented today with left-sided facial drooping.  She states that this has been present for approximately 48 hours.  She states that her symptoms began over the weekend with some itching in her left ear and some mild sinus congestion.  She states that that has resolved at this time however she states that Monday morning she noticed that her left side of her face was drooping.  She also states that her left eye is dry.  She is also some associated decreased taste that she has noticed.  No anosmia.  She denies any limb weakness or numbness.  No slurred speech, confusion, no other associate symptoms.  No aggravating mitigating factors.      Past Medical History:  Diagnosis Date  . Gestational diabetes     Patient Active Problem List   Diagnosis Date Noted  . Normal labor 09/09/2016  . SVD (spontaneous vaginal delivery) 09/09/2016  . Postpartum care following vaginal delivery (9/15) 09/09/2016  . Diabetes mellitus - pre-existing / IDDM 09/17/2011    Past Surgical History:  Procedure Laterality Date  . WISDOM TOOTH EXTRACTION       OB History    Gravida  5   Para  3   Term  3   Preterm      AB      Living  3     SAB      IAB      Ectopic      Multiple  0   Live Births  3           History reviewed. No pertinent family history.  Social History   Tobacco Use  . Smoking status: Never Smoker  Substance Use Topics  . Alcohol use: No  . Drug use: No    Home Medications Prior to Admission medications   Medication Sig Start Date End Date Taking? Authorizing Provider  artificial tears (LACRILUBE)  OINT ophthalmic ointment Place into both eyes at bedtime as needed for up to 21 days for dry eyes. 02/03/21 02/24/21 Yes Yisel Megill S, PA  predniSONE (DELTASONE) 10 MG tablet Take 6 tablets (60 mg total) by mouth daily for 7 days. 02/03/21 02/10/21 Yes Loreen Bankson S, PA  valACYclovir (VALTREX) 1000 MG tablet Take 1 tablet (1,000 mg total) by mouth 3 (three) times daily for 7 days. 02/03/21 02/10/21 Yes Shagun Wordell S, PA  Ascorbic Acid (VITAMIN C PO) Take 1 tablet by mouth daily.      [provider]  cholecalciferol (VITAMIN D) 1000 UNITS tablet Take 1,000 Units by mouth daily.      [provider]  fish oil-omega-3 fatty acids 1000 MG capsule Take 1 g by mouth daily.      [provider]  ibuprofen (ADVIL,MOTRIN) 800 MG tablet Take 1 tablet (800 mg total) by mouth every 8 (eight) hours. 09/11/16   Marlinda Mike., CNM  insulin NPH Human (HUMULIN N,NOVOLIN N) 100 UNIT/ML injection Inject 0.15 mLs (15 Units total) into the skin 2 (two) times daily at 8 am and 10 pm. 09/11/16   Marlinda Mike., CNM  iron polysaccharides (NIFEREX) 150 MG capsule Take  1 capsule (150 mg total) by mouth daily. 09/12/16   Marlinda Mike., CNM  magnesium oxide (MAG-OX) 400 (241.3 Mg) MG tablet Take 1 tablet (400 mg total) by mouth daily. 09/12/16   Marlinda Mike., CNM  metFORMIN (GLUCOPHAGE) 1000 MG tablet Take 1 tablet (1,000 mg total) by mouth 2 (two) times daily with a meal. 09/11/16   Marlinda Mike., CNM  Multiple Vitamin (MULTIVITAMIN PO) Take 1 tablet by mouth daily.      [provider]  Nutritional Supplements (FRUIT & VEGETABLE DAILY PO) Take 1 tablet by mouth daily.      [provider]  Probiotic Product (PRO-BIOTIC BLEND PO) Take 1 tablet by mouth daily.      [provider]    Allergies    Patient has no known allergies.  Review of Systems   Review of Systems  Constitutional: Negative for fever.  HENT: Positive for congestion and ear pain (now resolved).    Respiratory: Negative for shortness of breath.   Cardiovascular: Negative for chest pain.  Gastrointestinal: Negative for abdominal distention.  Neurological: Negative for dizziness and headaches.    Physical Exam Updated Vital Signs BP 138/82 (BP Location: Left Arm)   Pulse (!) 101   Temp 98.5 F (36.9 C) (Oral)   Resp 18   Ht 5\' 7"  (1.702 m)   Wt 88.5 kg   LMP 12/09/2020 (Approximate)   SpO2 98%   BMI 30.54 kg/m   Physical Exam Vitals and nursing note reviewed.  Constitutional:      General: She is not in acute distress. HENT:     Head: Normocephalic and atraumatic.     Right Ear: Tympanic membrane, ear canal and external ear normal.     Left Ear: Tympanic membrane, ear canal and external ear normal.     Nose: Nose normal.  Eyes:     General: No scleral icterus. Cardiovascular:     Rate and Rhythm: Normal rate and regular rhythm.     Pulses: Normal pulses.     Heart sounds: Normal heart sounds.  Pulmonary:     Effort: Pulmonary effort is normal. No respiratory distress.     Breath sounds: No wheezing.  Abdominal:     Palpations: Abdomen is soft.     Tenderness: There is no abdominal tenderness.  Musculoskeletal:     Cervical back: Normal range of motion.     Right lower leg: No edema.     Left lower leg: No edema.  Skin:    General: Skin is warm and dry.     Capillary Refill: Capillary refill takes less than 2 seconds.  Neurological:     Mental Status: She is alert. Mental status is at baseline.     Comments: Alert and oriented to self, place, time and event.   Speech is fluent, clear without dysarthria or dysphasia.   Strength 5/5 in upper/lower extremities  Sensation intact in upper/lower extremities   Normal gait.  Negative Romberg. No pronator drift.  Normal finger-to-nose and feet tapping.  CN I not tested  CN II grossly intact visual fields bilaterally. Did not visualize posterior eye.   CN III, IV, VI PERRLA and EOMs intact bilaterally  CN V  Intact sensation to sharp and light touch to the face  CN VII left side of face with decreased tone.  With both eyes closed left eye plates are easily separated right eye is able to remain closed against opposition.  Left eyebrow does not raise as high  as right side. CN VIII not tested  CN IX, X no uvula deviation, symmetric rise of soft palate  CN XI 5/5 SCM and trapezius strength bilaterally  CN XII Midline tongue protrusion, symmetric L/R movements   Psychiatric:        Mood and Affect: Mood normal.        Behavior: Behavior normal.     ED Results / Procedures / Treatments   Labs (all labs ordered are listed, but only abnormal results are displayed) Labs Reviewed - No data to display  EKG None  Radiology No results found.  Procedures Procedures   Medications Ordered in ED Medications - No data to display  ED Course  I have reviewed the triage vital signs and the nursing notes.  Pertinent labs & imaging results that were available during my care of the patient were reviewed by me and considered in my medical decision making (see chart for details).    MDM Rules/Calculators/A&P                          Patient is well-appearing 45 year old female with past medical history detailed in HPI presented today for left-sided facial weakness.  She states that she has noticed the symptoms Monday morning she states that they persisted over the past 2 days.  She states that she did have some itching and congestion in her left ear but states that the symptoms seem to have resolved.  She also has some mild sinus congestion which seems to be improving.  No cough fevers or chills.  She denies any neuro symptoms such as weakness, numbness, confusion or other.  Denies any headache.  No other associate symptoms.   Physical exam is unremarkable she is completely neurologically intact.  Patient has clinical diagnosis of Bell's palsy.  No indication for head imaging I have low suspicion  for stroke given her lack of neuro findings and her very low risk for stroke.  Patient given prescriptions for Bell's palsy.  She will follow up with her primary care doctor.  Very strict return precautions given.  Final Clinical Impression(s) / ED Diagnoses Final diagnoses:  Left-sided Bell's palsy    Rx / DC Orders ED Discharge Orders         Ordered    artificial tears (LACRILUBE) OINT ophthalmic ointment  At bedtime PRN        02/03/21 1110    predniSONE (DELTASONE) 10 MG tablet  Daily        02/03/21 1110    valACYclovir (VALTREX) 1000 MG tablet  3 times daily        02/03/21 1110           Solon Augusta Christine, [place] 02/03/21 1131    Bethann Berkshire, MD 02/07/21 [phone]

## 2021-02-03 NOTE — Discharge Instructions (Signed)
You are diagnosed with Bell's palsy today.  Please read the attached information.  It is prickly important for you to use lubricating eyedrops every hour or so during the waking hours.  Please use the Lacri-Lube eye ointment at night as we discussed.  I have prescribed this to you.  I have also written you prescription for prednisone as well as valacyclovir.  Both these medications will be taking for 1 week.  Prednisone you were taking once per day in the valacyclovir you were taking 3 times per day.  Please follow-up closely with your primary care doctor.  If you have been experiencing any other significant/severe symptoms such as those that we discussed which include but are not limited to slurred speech, confusion, weakness or numbness in any extremity please return immediately to the ER.

## 2021-05-26 DIAGNOSIS — E7800 Pure hypercholesterolemia, unspecified: Secondary | ICD-10-CM | POA: Diagnosis not present

## 2021-05-26 DIAGNOSIS — E119 Type 2 diabetes mellitus without complications: Secondary | ICD-10-CM | POA: Diagnosis not present

## 2021-05-26 DIAGNOSIS — Z79899 Other long term (current) drug therapy: Secondary | ICD-10-CM | POA: Diagnosis not present

## 2021-06-02 DIAGNOSIS — R7989 Other specified abnormal findings of blood chemistry: Secondary | ICD-10-CM | POA: Diagnosis not present

## 2021-06-02 DIAGNOSIS — Z79899 Other long term (current) drug therapy: Secondary | ICD-10-CM | POA: Diagnosis not present

## 2021-08-23 DIAGNOSIS — Z111 Encounter for screening for respiratory tuberculosis: Secondary | ICD-10-CM | POA: Diagnosis not present

## 2021-10-25 ENCOUNTER — Other Ambulatory Visit: Payer: Self-pay | Admitting: Family Medicine

## 2021-10-25 DIAGNOSIS — Z1231 Encounter for screening mammogram for malignant neoplasm of breast: Secondary | ICD-10-CM

## 2021-12-01 ENCOUNTER — Ambulatory Visit
Admission: RE | Admit: 2021-12-01 | Discharge: 2021-12-01 | Disposition: A | Discharge status: Home / Self Care | Payer: BC Managed Care – PPO | Source: Ambulatory Visit | Attending: Family Medicine | Admitting: Family Medicine

## 2021-12-01 DIAGNOSIS — Z1231 Encounter for screening mammogram for malignant neoplasm of breast: Secondary | ICD-10-CM

## 2021-12-02 DIAGNOSIS — R7989 Other specified abnormal findings of blood chemistry: Secondary | ICD-10-CM | POA: Diagnosis not present

## 2021-12-02 DIAGNOSIS — E119 Type 2 diabetes mellitus without complications: Secondary | ICD-10-CM | POA: Diagnosis not present

## 2021-12-02 DIAGNOSIS — Z79899 Other long term (current) drug therapy: Secondary | ICD-10-CM | POA: Diagnosis not present

## 2021-12-06 ENCOUNTER — Other Ambulatory Visit: Payer: Self-pay | Admitting: Family Medicine

## 2021-12-06 DIAGNOSIS — R928 Other abnormal and inconclusive findings on diagnostic imaging of breast: Secondary | ICD-10-CM

## 2021-12-09 DIAGNOSIS — Z0000 Encounter for general adult medical examination without abnormal findings: Secondary | ICD-10-CM | POA: Diagnosis not present

## 2022-01-14 DIAGNOSIS — N920 Excessive and frequent menstruation with regular cycle: Secondary | ICD-10-CM | POA: Diagnosis not present

## 2022-01-14 DIAGNOSIS — Z6831 Body mass index (BMI) 31.0-31.9, adult: Secondary | ICD-10-CM | POA: Diagnosis not present

## 2022-01-14 DIAGNOSIS — Z01419 Encounter for gynecological examination (general) (routine) without abnormal findings: Secondary | ICD-10-CM | POA: Diagnosis not present

## 2022-01-14 DIAGNOSIS — Z124 Encounter for screening for malignant neoplasm of cervix: Secondary | ICD-10-CM | POA: Diagnosis not present

## 2022-01-14 DIAGNOSIS — Z113 Encounter for screening for infections with a predominantly sexual mode of transmission: Secondary | ICD-10-CM | POA: Diagnosis not present

## 2022-01-19 ENCOUNTER — Ambulatory Visit
Admission: RE | Admit: 2022-01-19 | Discharge: 2022-01-19 | Disposition: A | Discharge status: Home / Self Care | Payer: BC Managed Care – PPO | Source: Ambulatory Visit | Attending: Family Medicine | Admitting: Family Medicine

## 2022-01-19 ENCOUNTER — Other Ambulatory Visit: Payer: Self-pay

## 2022-01-19 DIAGNOSIS — R922 Inconclusive mammogram: Secondary | ICD-10-CM | POA: Diagnosis not present

## 2022-01-19 DIAGNOSIS — R928 Other abnormal and inconclusive findings on diagnostic imaging of breast: Secondary | ICD-10-CM

## 2022-01-31 DIAGNOSIS — N920 Excessive and frequent menstruation with regular cycle: Secondary | ICD-10-CM | POA: Diagnosis not present

## 2022-02-06 IMAGING — MG MM DIGITAL DIAGNOSTIC UNILAT*L* W/ TOMO W/ CAD
4 series · 4 of 12 positions shown · non-contrast
Comparison: Previous exam(s).
COMPARISON: Previous exam(s).

Addendum:
CLINICAL DATA: 45-year-old female recalled from screening mammogram
dated 12/01/2021 for a possible left breast mass.

EXAM:
DIGITAL DIAGNOSTIC UNILATERAL LEFT MAMMOGRAM WITH TOMOSYNTHESIS AND
CAD
TECHNIQUE: Left digital diagnostic mammography and breast tomosynthesis was
performed. The images were evaluated with computer-aided detection.
Ultrasound of the left breast was also performed.
*** End of Addendum ***

[L CC synth-2D]
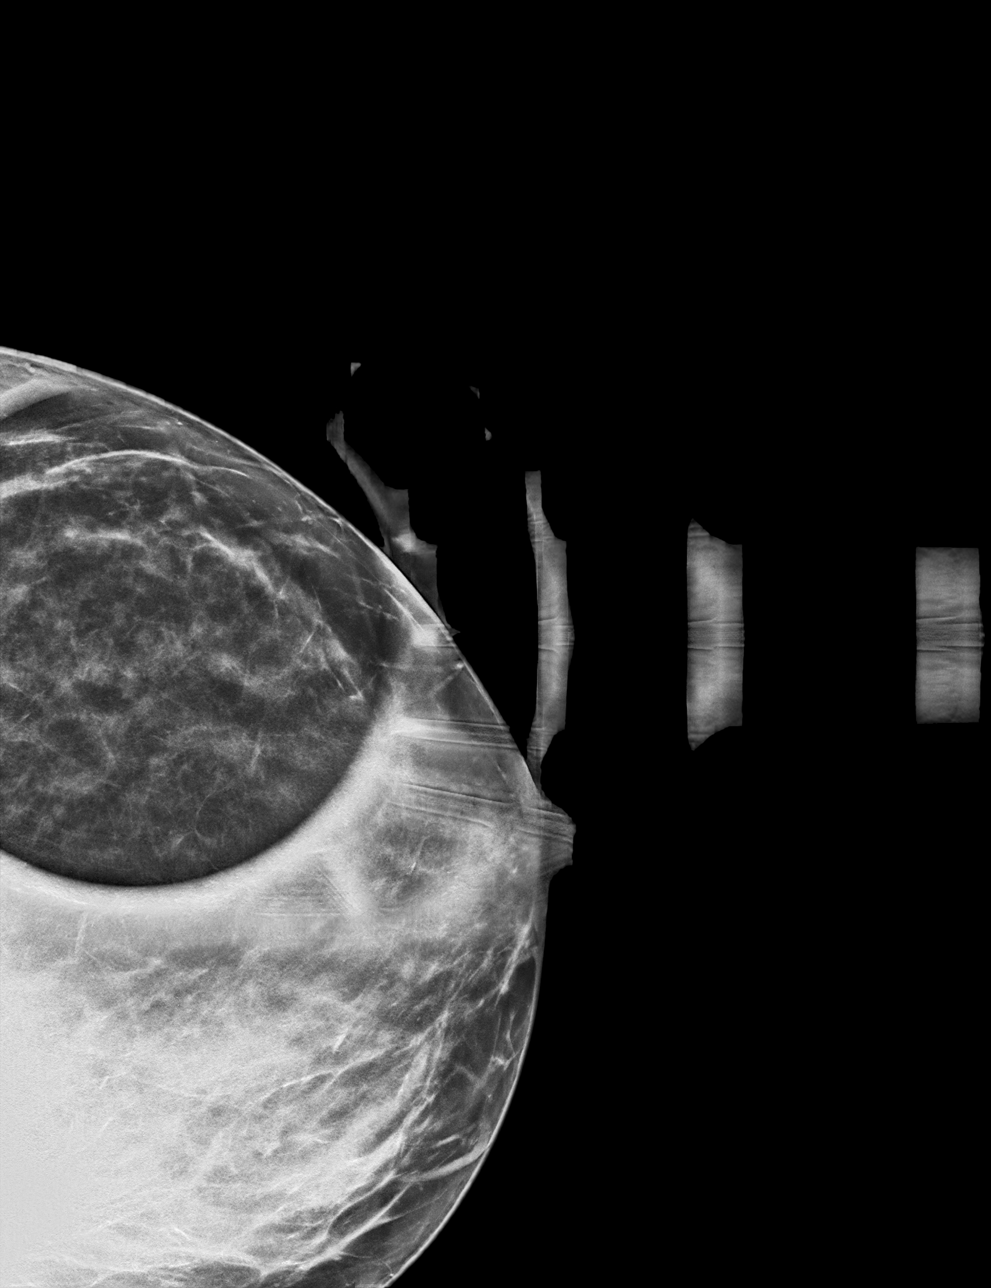

[L ML synth-2D]
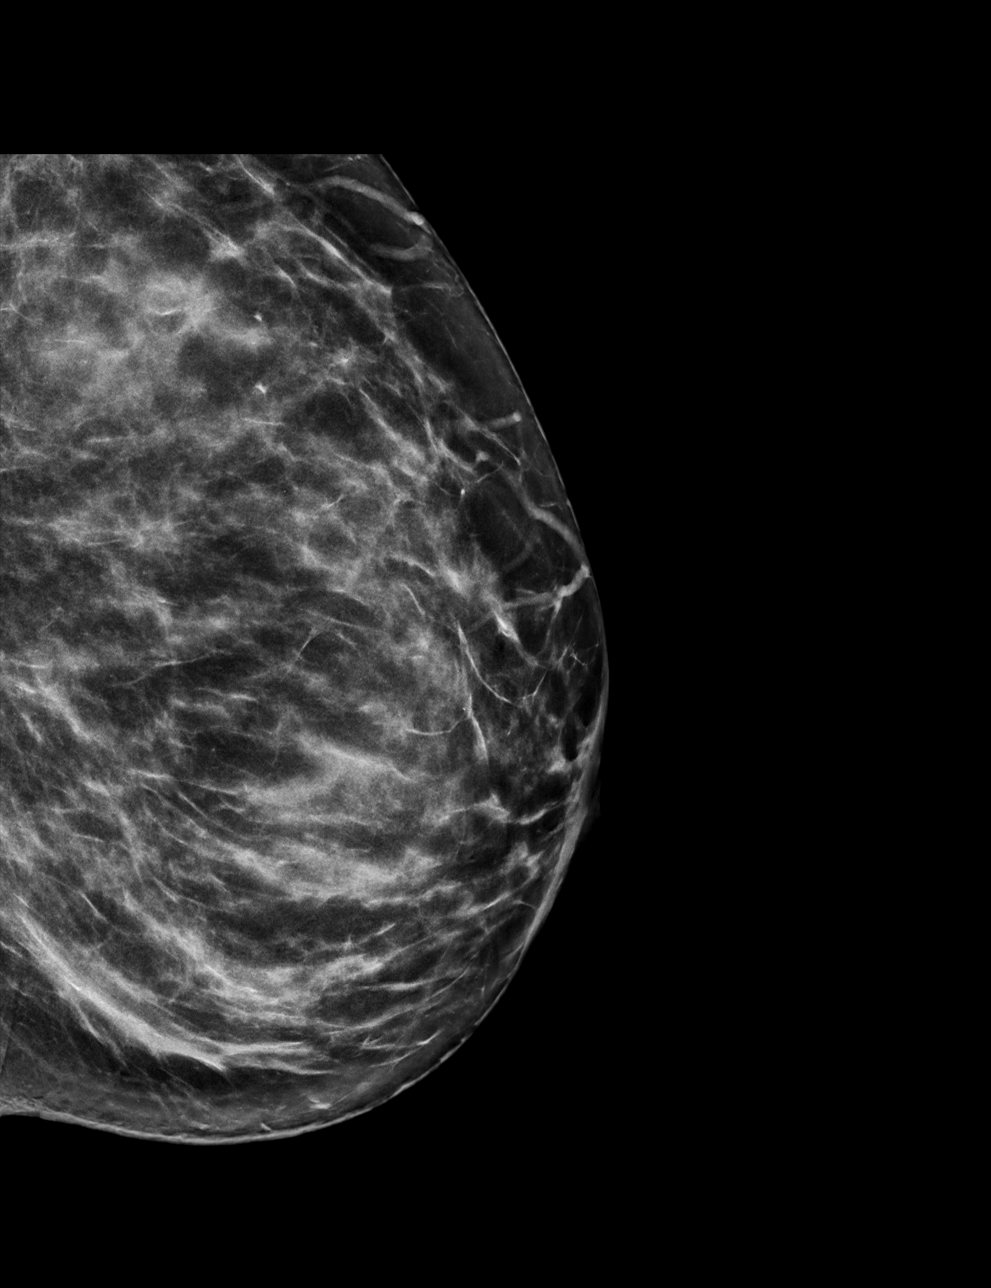

[L CC tomo · tomo slice 30/59.0]
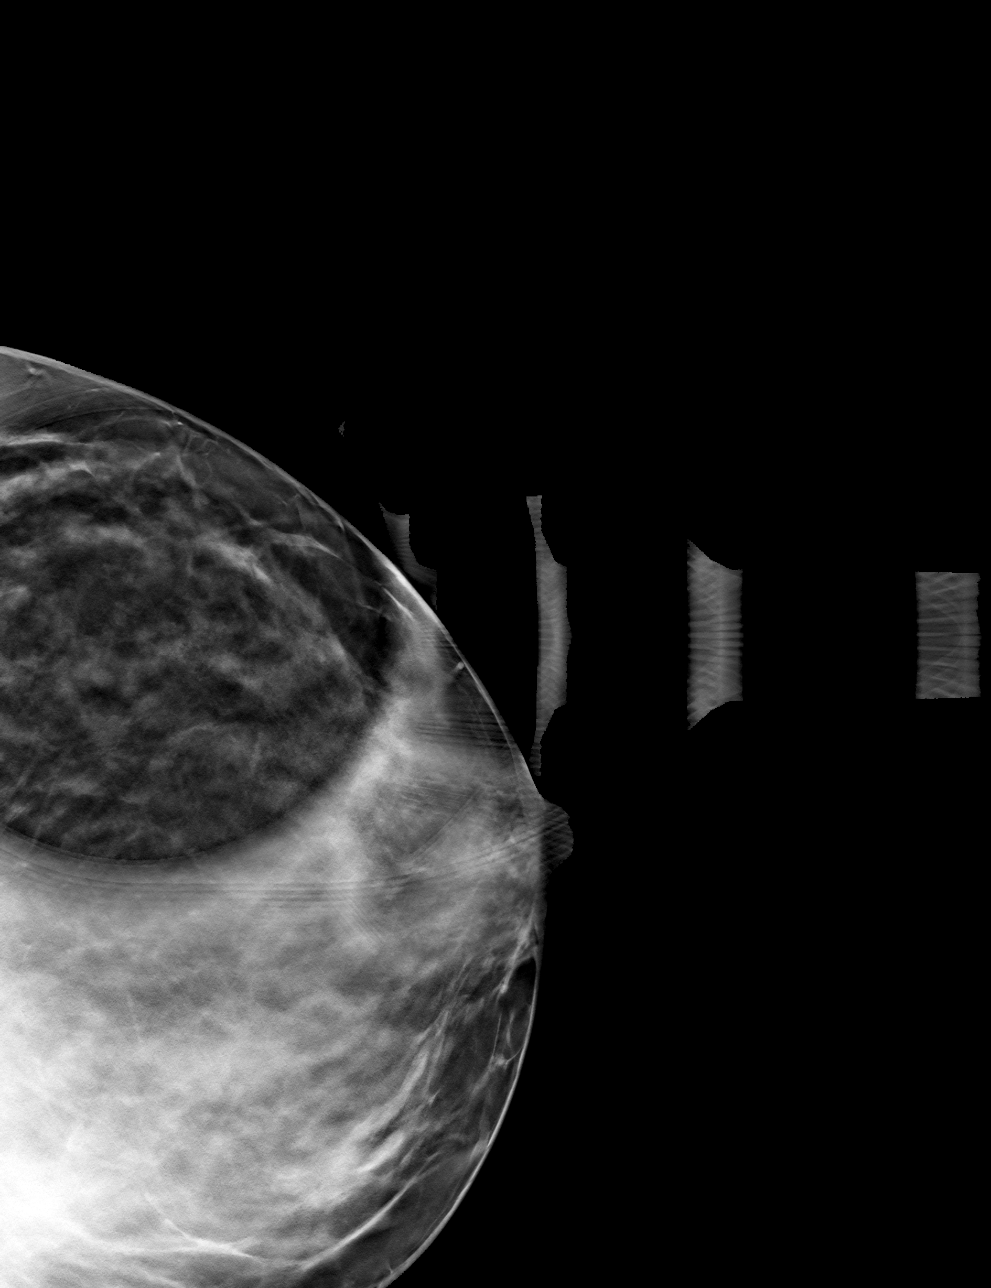

[L ML tomo · tomo slice 36/71.0]
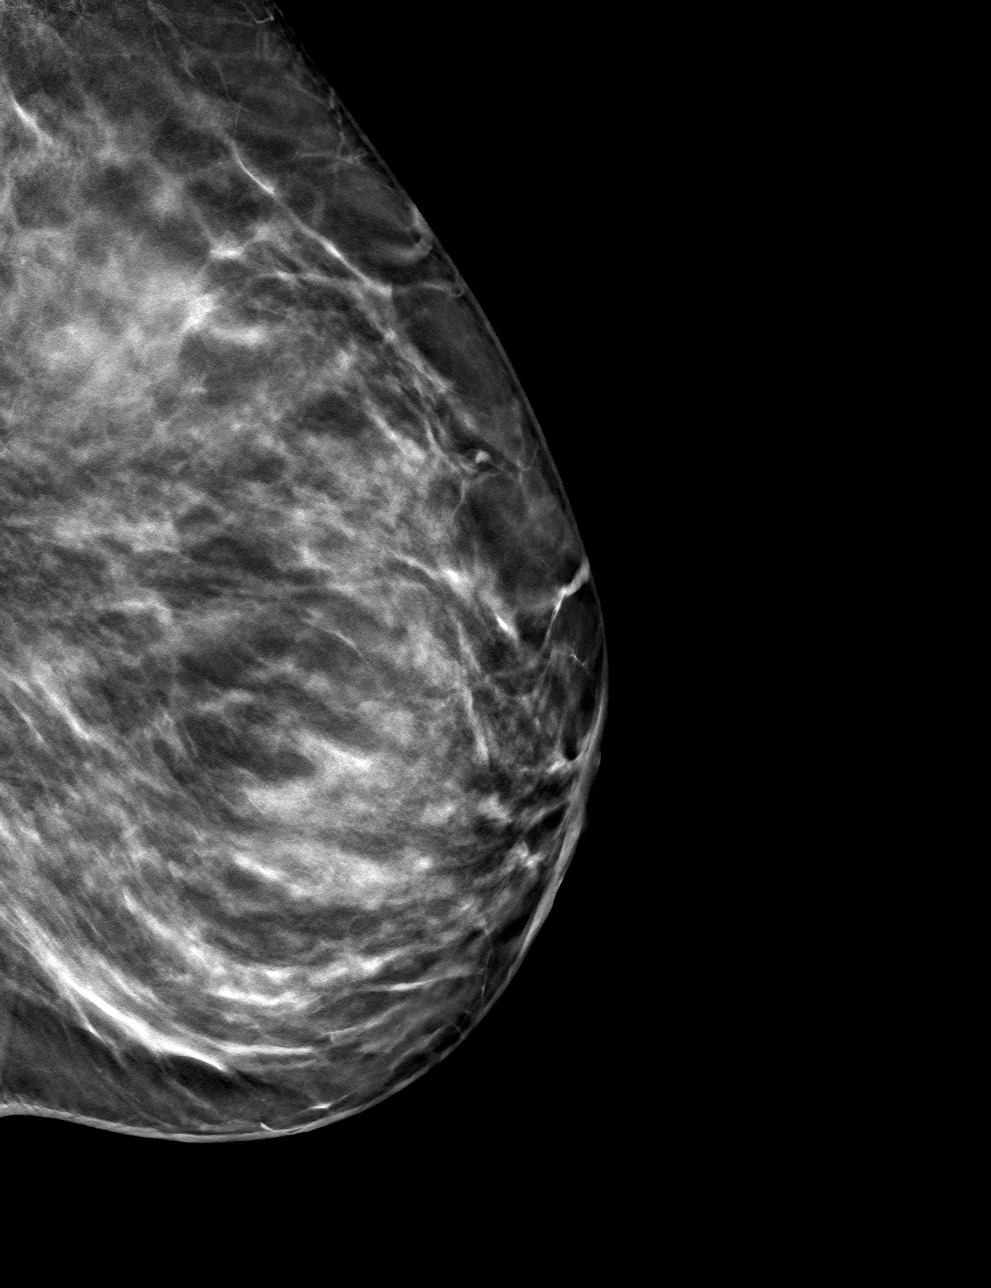

[4 of 12 positions shown; findings below may reference images not displayed]

ACR Breast Density Category c: The breast tissue is heterogeneously
dense, which may obscure small masses.
FINDINGS: Previously described, possible mass in the lateral left breast on
the CC projection effaces on today's additional views. No suspicious
findings are identified mammographically. Precautionary ultrasound
was performed.

Targeted ultrasound is performed, showing no focal or suspicious
sonographic abnormality in the lateral left breast.
IMPRESSION: No mammographic or sonographic evidence of malignancy on the left.

RECOMMENDATION:
Screening mammogram in one year.(Code:Y1-N-5TS)

I have discussed the findings and recommendations with the patient.
If applicable, a reminder letter will be sent to the patient
regarding the next appointment.

BI-RADS CATEGORY  1: Negative.
ACR Breast Density Category c: The breast tissue is heterogeneously
dense, which may obscure small masses.
FINDINGS: Previously described, possible mass in the lateral left breast on
the CC projection effaces on today's additional views. No suspicious
findings are identified mammographically. Precautionary ultrasound
was performed.

Targeted ultrasound is performed, showing no focal or suspicious
sonographic abnormality in the lateral left breast.
IMPRESSION: No mammographic or sonographic evidence of malignancy on the left.

RECOMMENDATION:
Screening mammogram in one year.(Code:Y1-N-5TS)

I have discussed the findings and recommendations with the patient.
If applicable, a reminder letter will be sent to the patient
regarding the next appointment.

BI-RADS CATEGORY  1: Negative.

## 2022-02-06 IMAGING — US US BREAST*L* LIMITED INC AXILLA
1 series · 3 of 3 positions shown · non-contrast
Comparison: Previous exam(s).
COMPARISON: Previous exam(s).

Addendum:
CLINICAL DATA: 45-year-old female recalled from screening mammogram
dated 12/01/2021 for a possible left breast mass.

EXAM:
DIGITAL DIAGNOSTIC UNILATERAL LEFT MAMMOGRAM WITH TOMOSYNTHESIS AND
CAD
TECHNIQUE: Left digital diagnostic mammography and breast tomosynthesis was
performed. The images were evaluated with computer-aided detection.
Targeted ultrasound examination of the left breast was performed.
*** End of Addendum ***

[Series 1: us breast*left* limited inc axilla · 0.07mm/px · 3 of 3 slices shown]
[im 1/3]
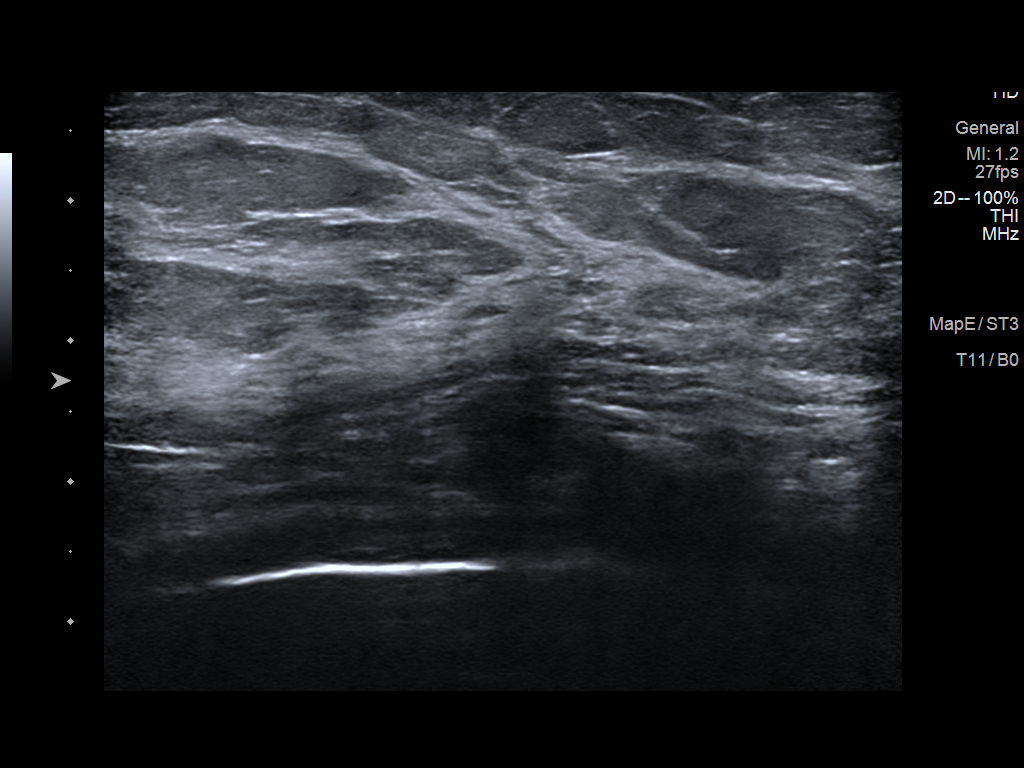
[im 2/3]
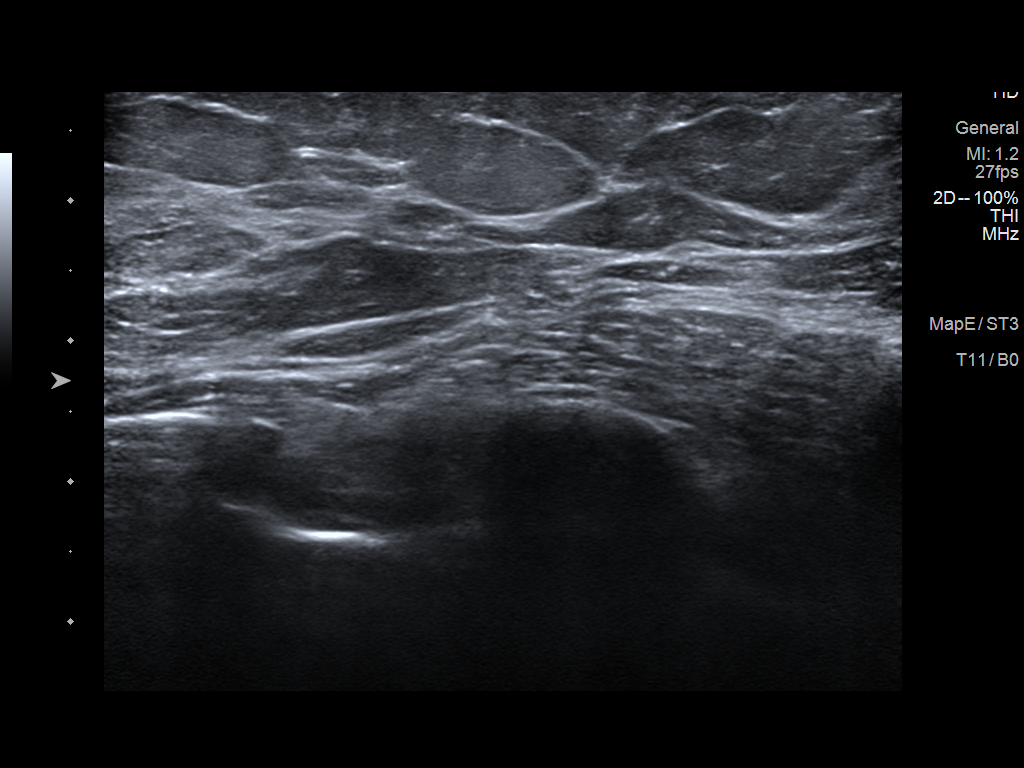
[im 3/3]
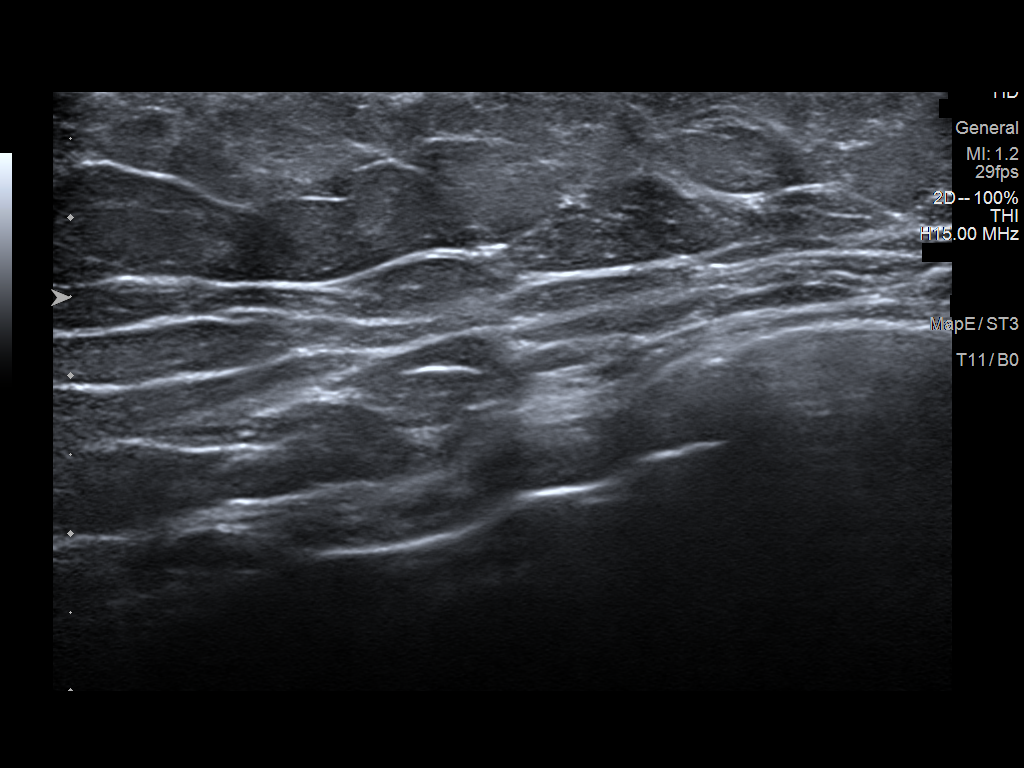

[3 of 3 positions shown; findings below may reference images not displayed]

ACR Breast Density Category c: The breast tissue is heterogeneously
dense, which may obscure small masses.
FINDINGS: Previously described, possible mass in the lateral left breast on
the CC projection effaces on today's additional views. No suspicious
findings are identified mammographically. Precautionary ultrasound
was performed.

Targeted ultrasound is performed, showing no focal or suspicious
sonographic abnormality in the lateral left breast.
IMPRESSION: No mammographic or sonographic evidence of malignancy on the left.

RECOMMENDATION:
Screening mammogram in one year.(Code:DB-[id number])

I have discussed the findings and recommendations with the patient.
If applicable, a reminder letter will be sent to the patient
regarding the next appointment.

BI-RADS CATEGORY  1: Negative.
ACR Breast Density Category c: The breast tissue is heterogeneously
dense, which may obscure small masses.
FINDINGS: Previously described, possible mass in the lateral left breast on
the CC projection effaces on today's additional views. No suspicious
findings are identified mammographically. Precautionary ultrasound
was performed.

Targeted ultrasound is performed, showing no focal or suspicious
sonographic abnormality in the lateral left breast.
IMPRESSION: No mammographic or sonographic evidence of malignancy on the left.

RECOMMENDATION:
Screening mammogram in one year.(Code:[id number])

I have discussed the findings and recommendations with the patient.
If applicable, a reminder letter will be sent to the patient
regarding the next appointment.

BI-RADS CATEGORY  1: Negative.

## 2022-03-31 DIAGNOSIS — E119 Type 2 diabetes mellitus without complications: Secondary | ICD-10-CM | POA: Diagnosis not present

## 2022-03-31 DIAGNOSIS — Z79899 Other long term (current) drug therapy: Secondary | ICD-10-CM | POA: Diagnosis not present

## 2022-04-08 DIAGNOSIS — E139 Other specified diabetes mellitus without complications: Secondary | ICD-10-CM | POA: Diagnosis not present

## 2022-07-29 DIAGNOSIS — E139 Other specified diabetes mellitus without complications: Secondary | ICD-10-CM | POA: Diagnosis not present

## 2022-08-05 DIAGNOSIS — E139 Other specified diabetes mellitus without complications: Secondary | ICD-10-CM | POA: Diagnosis not present

## 2022-09-05 DIAGNOSIS — E119 Type 2 diabetes mellitus without complications: Secondary | ICD-10-CM | POA: Diagnosis not present

## 2022-09-05 DIAGNOSIS — R7989 Other specified abnormal findings of blood chemistry: Secondary | ICD-10-CM | POA: Diagnosis not present

## 2022-09-05 DIAGNOSIS — Z79899 Other long term (current) drug therapy: Secondary | ICD-10-CM | POA: Diagnosis not present

## 2022-09-05 DIAGNOSIS — E7800 Pure hypercholesterolemia, unspecified: Secondary | ICD-10-CM | POA: Diagnosis not present

## 2022-12-09 DIAGNOSIS — Z79899 Other long term (current) drug therapy: Secondary | ICD-10-CM | POA: Diagnosis not present

## 2022-12-09 DIAGNOSIS — E119 Type 2 diabetes mellitus without complications: Secondary | ICD-10-CM | POA: Diagnosis not present

## 2022-12-09 DIAGNOSIS — R7989 Other specified abnormal findings of blood chemistry: Secondary | ICD-10-CM | POA: Diagnosis not present

## 2022-12-09 DIAGNOSIS — E7800 Pure hypercholesterolemia, unspecified: Secondary | ICD-10-CM | POA: Diagnosis not present

## 2022-12-15 DIAGNOSIS — Z0000 Encounter for general adult medical examination without abnormal findings: Secondary | ICD-10-CM | POA: Diagnosis not present

## 2022-12-15 DIAGNOSIS — E119 Type 2 diabetes mellitus without complications: Secondary | ICD-10-CM | POA: Diagnosis not present

## 2022-12-28 ENCOUNTER — Other Ambulatory Visit: Payer: Self-pay | Admitting: Family Medicine

## 2022-12-28 DIAGNOSIS — Z1231 Encounter for screening mammogram for malignant neoplasm of breast: Secondary | ICD-10-CM

## 2022-12-29 ENCOUNTER — Ambulatory Visit
Admission: RE | Admit: 2022-12-29 | Discharge: 2022-12-29 | Disposition: A | Discharge status: Home / Self Care | Payer: No Typology Code available for payment source | Source: Ambulatory Visit | Attending: Family Medicine | Admitting: Family Medicine

## 2022-12-29 DIAGNOSIS — Z1231 Encounter for screening mammogram for malignant neoplasm of breast: Secondary | ICD-10-CM

## 2023-02-17 ENCOUNTER — Ambulatory Visit: Payer: BC Managed Care – PPO

## 2024-02-06 ENCOUNTER — Other Ambulatory Visit: Payer: Self-pay | Admitting: Family Medicine

## 2024-02-06 DIAGNOSIS — Z1231 Encounter for screening mammogram for malignant neoplasm of breast: Secondary | ICD-10-CM

## 2024-02-07 ENCOUNTER — Ambulatory Visit
Admission: RE | Admit: 2024-02-07 | Discharge: 2024-02-07 | Discharge status: Home / Self Care | Payer: BC Managed Care – PPO | Source: Ambulatory Visit | Attending: Family Medicine | Admitting: Family Medicine

## 2024-02-07 DIAGNOSIS — Z1231 Encounter for screening mammogram for malignant neoplasm of breast: Secondary | ICD-10-CM
